# Patient Record
Sex: Male | Born: 1980 | Race: Black or African American | Hispanic: No | Marital: Married | Smoking: Current every day smoker
Health system: Southern US, Community
[De-identification: ages and names within clinical notes are randomized; demographics above are authoritative.]

## PROBLEM LIST (undated history)

## (undated) HISTORY — PX: APPENDECTOMY: SHX54

---

## 2009-09-14 ENCOUNTER — Emergency Department (HOSPITAL_COMMUNITY): Admission: EM | Admit: 2009-09-14 | Discharge: 2009-09-14 | Payer: Self-pay | Admitting: Emergency Medicine

## 2010-01-21 ENCOUNTER — Emergency Department (HOSPITAL_COMMUNITY): Admission: EM | Admit: 2010-01-21 | Discharge: 2010-01-21 | Payer: Self-pay | Admitting: Emergency Medicine

## 2011-02-03 ENCOUNTER — Emergency Department (HOSPITAL_COMMUNITY): Payer: Managed Care, Other (non HMO)

## 2011-02-03 ENCOUNTER — Other Ambulatory Visit: Payer: Self-pay | Admitting: General Surgery

## 2011-02-03 ENCOUNTER — Inpatient Hospital Stay (HOSPITAL_COMMUNITY)
Admission: EM | Admit: 2011-02-03 | Discharge: 2011-02-04 | DRG: 343 | Disposition: A | Discharge status: Home / Self Care | Payer: Managed Care, Other (non HMO) | Attending: General Surgery | Admitting: General Surgery

## 2011-02-03 ENCOUNTER — Encounter (HOSPITAL_COMMUNITY): Payer: Self-pay

## 2011-02-03 DIAGNOSIS — K3580 Unspecified acute appendicitis: Principal | ICD-10-CM | POA: Diagnosis present

## 2011-02-03 DIAGNOSIS — F17200 Nicotine dependence, unspecified, uncomplicated: Secondary | ICD-10-CM | POA: Diagnosis present

## 2011-02-03 LAB — URINE MICROSCOPIC-ADD ON

## 2011-02-03 LAB — DIFFERENTIAL
Basophils Absolute: 0 10*3/uL (ref 0.0–0.1)
Basophils Relative: 0 % (ref 0–1)
Eosinophils Absolute: 0 10*3/uL (ref 0.0–0.7)
Monocytes Absolute: 0.7 10*3/uL (ref 0.1–1.0)
Monocytes Relative: 7 % (ref 3–12)
Neutro Abs: 7.9 10*3/uL — ABNORMAL HIGH (ref 1.7–7.7)
Neutrophils Relative %: 80 % — ABNORMAL HIGH (ref 43–77)

## 2011-02-03 LAB — URINALYSIS, ROUTINE W REFLEX MICROSCOPIC
Glucose, UA: NEGATIVE mg/dL
Ketones, ur: NEGATIVE mg/dL
Leukocytes, UA: NEGATIVE
Nitrite: NEGATIVE
pH: 6 (ref 5.0–8.0)

## 2011-02-03 LAB — CBC
Hemoglobin: 14.7 g/dL (ref 13.0–17.0)
MCH: 29.1 pg (ref 26.0–34.0)
MCHC: 34.7 g/dL (ref 30.0–36.0)
Platelets: 203 10*3/uL (ref 150–400)
RBC: 5.06 MIL/uL (ref 4.22–5.81)

## 2011-02-03 LAB — COMPREHENSIVE METABOLIC PANEL
ALT: 25 U/L (ref 0–53)
AST: 35 U/L (ref 0–37)
Albumin: 4 g/dL (ref 3.5–5.2)
Calcium: 9.2 mg/dL (ref 8.4–10.5)
Creatinine, Ser: 1.06 mg/dL (ref 0.4–1.5)
GFR calc Af Amer: >60 mL/min (ref >60)
Sodium: 134 mEq/L — ABNORMAL LOW (ref 135–145)

## 2011-02-03 MED ORDER — IOHEXOL 300 MG/ML  SOLN
100.0000 mL | Freq: Once | INTRAMUSCULAR | Status: AC | PRN
  Administered 2011-02-03: 100 mL via INTRAVENOUS

## 2011-02-04 LAB — CBC
HCT: 37.2 % — ABNORMAL LOW (ref 39.0–52.0)
Hemoglobin: 12.5 g/dL — ABNORMAL LOW (ref 13.0–17.0)
MCH: 28.5 pg (ref 26.0–34.0)
MCHC: 33.6 g/dL (ref 30.0–36.0)
MCV: 84.7 fL (ref 78.0–100.0)
RBC: 4.39 MIL/uL (ref 4.22–5.81)

## 2011-02-06 NOTE — Discharge Summary (Signed)
  NAMEMarland Jordan  BERLE, FITZ NO.:  [id number]  MEDICAL RECORD NO.:  [id number]           PATIENT TYPE:  I  LOCATION:  1528                         FACILITY:  [facility]  PHYSICIAN:  Sandria Bales. Ezzard Standing, M.D.  DATE OF BIRTH:  19-Oct-1981  DATE OF ADMISSION:  02/03/2011 DATE OF DISCHARGE:  02/04/2011                              DISCHARGE SUMMARY   DISCHARGE DIAGNOSIS:  Acute appendicitis.  OPERATIONS PERFORMED:  The patient had a laparoscopic appendectomy by Dr. Glenna Fellows on February 03, 2011.  HISTORY OF ILLNESS:  [name] is an otherwise healthy 30 year old black male who had about a 24-hour history of abdominal pain, presented to the [facility] Emergency Room who had a CT scan consistent with appendicitis.  He otherwise has no significant medical issues.  He was taken to the operating room by Dr. Johna Sheriff on February 03, 2011, where he underwent a laparoscopic appendectomy.  The patient is now 1 day postop.  His temperature is 98.3, pulse 65, white blood count is 11,600.  He is taking p.o.'s and is ready for discharge.  His discharge instructions include no driving for 2 days.   He is unlimited on his diet.   He can shower starting tomorrow.  He is to call and make an appointment with Dr. Johna Sheriff in 2-3 weeks. He is given Vicodin for pain for discharge, he is given 20 tablets with 1 refill and I wrote him a note to return to work on February 15, 2011.   Sandria Bales. Ezzard Standing, M.D., FACS   DHN/MEDQ  D:  02/04/2011  T:  02/04/2011  Job:  [id number]  Electronically Signed by Ovidio Kin M.D. on 02/05/2011 05:44:26 PM

## 2011-02-13 NOTE — Op Note (Signed)
NAMEMarland Jordan  LANARD, ARGUIJO NO.:  [id number]  MEDICAL RECORD NO.:  [id number]           PATIENT TYPE:  E  LOCATION:  WLED                         FACILITY:  [facility]  PHYSICIAN:  Sharlet Salina T. Iza Preston, M.D.DATE OF BIRTH:  03/22/1981  DATE OF PROCEDURE:  02/03/2011 DATE OF DISCHARGE:                              OPERATIVE REPORT   PREOPERATIVE DIAGNOSIS:  Acute appendicitis.  POSTOPERATIVE DIAGNOSIS:  Acute appendicitis.  SURGICAL PROCEDURE:  Laparoscopic appendectomy.  SURGEON:  Lorne Skeens. Zamora Colton, M.D.  ANESTHESIA:  General.  BRIEF HISTORY:  Jonathon Jordan is a 30 year old male who presents with about 30 hours of progressive right lower quadrant abdominal pain and anorexia.  He has localized tenderness in the right lower quadrant. White count was normal.  CT scan, however, reveals a significantly edematous dilated appendix with appendicolith and significant periappendiceal inflammation but no evidence of perforation.  For these findings, I have recommended proceeding with emergency laparoscopic and possible open appendectomy.  The nature of the procedure, its indications, risks of anesthetic complications, bleeding, infection, possible need for open procedure were discussed and understood.  He was brought to operating room for this procedure.  DESCRIPTION OF OPERATION:  The patient received broad-spectrum IV antibiotics.  He was brought to the operating room, placed in supine position on the operating room table and general orotracheal anesthesia was induced.  Foley catheter was placed.  The abdomen was widely sterilely prepped and draped.  PAS were in place.  Correct patient and procedure were verified.  The patient was noted to have a small umbilical hernia.  An infraumbilical incision was used and the umbilical skin dissected up off the hernia and the peritoneal sac excised.  This left about a 1 cm fascial defect.  Stay sutures were placed on either side  and the [facility] trocar placed and pneumoperitoneum established. Under direct vision, a 5-mm trocar was placed in the right upper quadrant and a 12-mm trocar in the left lower quadrant.  The cecum and base of appendix were identified and the base of the appendix was inflamed, dilated, and edematous extending down right to the base of the appendix.  Tracing the appendix out distally, it became markedly dilated with significant both acute and subacute inflammation and curled back up and was very adherent to the lateral pelvic sidewall and back up to the inferior lateral portion of the cecum.  Using the harmonic scalpel and careful blunt dissection, the peritoneum was incised lateral to the appendix and was carefully mobilized away from the pelvic sidewall. There was significant inflammation and adhesions around the appendix consistent with recurrent or subacute inflammation as well as acute inflammation.  However, with rather tedious careful dissection, I was able to mobilize the appendix off the lateral pelvic sidewall, identified the tip of the appendix, got around the tip of the appendix and then began to mobilize it away from the cecum.  As I incised the inflammatory attachments between the cecum and the appendix, I got into a cleaner and edematous plane and was able to easily bluntly dissect the appendix away from the cecum without any concern of injury to the cecum. The appendix  was then able eventually to be elevated and the edematous somewhat fibrotic mesoappendix was sequentially divided with the harmonic scalpel working back towards the base of appendix until the appendix was completely free including the tip of the cecum.  Although the tissue was edematous, it looked amenable for staple closure and a blue load 45-mm linear stapler was used to divide the appendix at the tip of the cecum.  The staple line was intact and without bleeding. Appendix was placed in EndoCatch bag and  brought out through the umbilicus.  Due to the large appendicolith and very large appendix, I had to extend the fascial defect laterally for about 0.5 cm on either direction to remove the appendix.  Following this, the fascial edges were clearly defined and then initially 2-0 Prolene sutures were placed on either corner of the transverse umbilical fascial defect.  The [facility] trocar replaced, pneumoperitoneum established.  The abdomen was thoroughly irrigated.  The appendiceal stump was intact.  There was no bleeding.  No evidence of trocar injury or other problems.  Following this, the umbilical fascial defect was closed with several interrupted 0 Prolene sutures in transverse fashion.  I then reinsufflated the abdomen to make sure this incision was clear of any viscera which it was.  Again there was no evidence of bleeding from the operative site.  All CO2 was evacuated and trocars removed.  Skin incisions were closed with subcuticular Monocryl and Dermabond.  Sponge and needle counts were correct.  The patient was taken to recovery room in good condition.     Lorne Skeens. Jacobey Gura, M.D.     Tory Emerald  D:  02/03/2011  T:  02/03/2011  Job:  [id number]  Electronically Signed by Glenna Fellows M.D. on 02/13/2011 10:02:39 AM

## 2011-02-13 NOTE — H&P (Signed)
  NAMEMarland Kitchen  Jonathon Jordan NO.:  [id number]  MEDICAL RECORD NO.:  [id number]           PATIENT TYPE:  E  LOCATION:  WLED                         FACILITY:  [facility]  PHYSICIAN:  Sharlet Salina T. Erikah Thumm, M.D.DATE OF BIRTH:  Feb 13, 1981  DATE OF ADMISSION:  02/03/2011 DATE OF DISCHARGE:                             HISTORY & PHYSICAL   CHIEF COMPLAINT:  Abdominal pain.  HISTORY OF PRESENT ILLNESS:  Jonathon Jordan is a 30 year old male who a little over 24 hours ago developed the gradual onset of right lower quadrant abdominal pain.  This initially was not severe.  It was somewhat worse with motion and activity.  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  He then began to feel weak and lightheaded and presented to the [facility] Emergency Room.  He has not had any fever or chills.  He has not had a nausea or vomiting, but has had decreased appetite.  Bowel movement was normal yesterday.  He has no history of any chronic or recurring abdominal pain or GI complaints.  PAST MEDICAL HISTORY:  No previous medical, surgical illness or hospitalization.  MEDICATIONS:  None.  ALLERGIES:  None.  SOCIAL HISTORY:  Smokes several cigarettes per day.  Drinks an occasional alcoholic drink.  FAMILY HISTORY:  Noncontributory.  REVIEW OF SYSTEMS:  GENERAL:  No fever or chills.  HEENT:  No vision, hearing, or swallowing problems.  RESPIRATORY:  No shortness of breath, asthma, or cough.  CARDIAC:  No history of chest pain or palpitations. ABDOMEN/GASTROINTESTINAL:  As above.  GENITOURINARY:  Negative.  PHYSICAL EXAMINATION:  VITAL SIGNS:  Temperature is 98.7, pulse 109, respirations 20, blood pressure 145/89. GENERAL:  He is a mildly overweight African-American male in no acute distress. SKIN:  Warm and dry.  No rash or infection. HEENT:  No palpable mass or thyromegaly.  Sclerae nonicteric.   Pupils equal, round and reactive.  Oropharynx clear. LUNGS:  Clear without wheezing or increased work of breathing. CARDIAC:  Regular rate and rhythm.  No murmurs.  No edema. ABDOMEN:  Bowel sounds are hypoactive.  There is localized right lower quadrant tenderness with some guarding.  No palpable masses or hepatosplenomegaly. EXTREMITIES:  No joint swelling or deformity. NEUROLOGIC:  Alert and fully oriented.  Motor and sensory examinations grossly normal.  LABORATORY DATA:  Lipase, electrolytes, LFTs, CBC are all within normal limits with a white count of 9.9.  Urinalysis unremarkable.  RADIOLOGIC:  CT scan of the abdomen and pelvis was reviewed.  This shows an appendicolith and a significantly dilated appendix with periappendiceal inflammation consistent with acute appendicitis.  No evidence of perforation or abscess.  ASSESSMENT AND PLAN:  Acute appendicitis.  The patient will receive broad-spectrum intravenous antibiotics and will be taken to the operating room for emergency laparoscopic and possible open appendectomy.     Lorne Skeens. Chabely Norby, M.D.     Tory Emerald  D:  02/03/2011  T:  02/03/2011  Job:  [id number]  Electronically Signed by Glenna Fellows M.D. on 02/13/2011 10:02:26 AM

## 2012-01-15 ENCOUNTER — Emergency Department (HOSPITAL_COMMUNITY)
Admission: EM | Admit: 2012-01-15 | Discharge: 2012-01-15 | Disposition: A | Discharge status: Home / Self Care | Payer: Managed Care, Other (non HMO) | Attending: Emergency Medicine | Admitting: Emergency Medicine

## 2012-01-15 ENCOUNTER — Emergency Department (HOSPITAL_COMMUNITY): Payer: Managed Care, Other (non HMO)

## 2012-01-15 ENCOUNTER — Encounter (HOSPITAL_COMMUNITY): Payer: Self-pay | Admitting: *Deleted

## 2012-01-15 DIAGNOSIS — Y9289 Other specified places as the place of occurrence of the external cause: Secondary | ICD-10-CM | POA: Insufficient documentation

## 2012-01-15 DIAGNOSIS — S99929A Unspecified injury of unspecified foot, initial encounter: Secondary | ICD-10-CM | POA: Insufficient documentation

## 2012-01-15 DIAGNOSIS — IMO0002 Reserved for concepts with insufficient information to code with codable children: Secondary | ICD-10-CM | POA: Insufficient documentation

## 2012-01-15 DIAGNOSIS — S8990XA Unspecified injury of unspecified lower leg, initial encounter: Secondary | ICD-10-CM | POA: Insufficient documentation

## 2012-01-15 DIAGNOSIS — S99919A Unspecified injury of unspecified ankle, initial encounter: Secondary | ICD-10-CM

## 2012-01-15 MED ORDER — TRAMADOL HCL 50 MG PO TABS: 50.0000 mg | ORAL_TABLET | Freq: Four times a day (QID) | ORAL | Status: AC | PRN

## 2012-01-15 NOTE — Discharge Instructions (Signed)
Athletic Injuries   Proper early treatment and rehabilitation leads to a quicker recovery for most athletic injuries. You may be able to return to your sport fully recovered in less time if you follow these general rules:   Rest. Rest the injury until movement is no longer painful. Using an injured joint or muscle will prolong the problem.   Elevate. Keep the injured area elevated until most of the swelling and pain are gone. If possible, keep the injured area above the level of your heart.   Ice. Use ice packs directly on the injury for 3 to 4 days.   Compression. Use an elastic bandage applied to your injury as directed. This will reduce swelling, although elastic wraps do not protect injured joints. More rigid splints and taping are better for this purpose.   Rehabilitation. This should begin as soon as the swelling and pain of your injury subside, and as directed by your caregiver. It includes exercises to improve joint motion and muscular strength. Occasionally special braces, splints, or orthotics are used to protect against further injury when you return to your sport.  Keeping a positive attitude will help you heal your injury more rapidly and completely. You may return to physical exercise that does not cause pain or increase the risk of re-injury or as directed. This will help maintain fitness. It will also improve your mental attitude. Do not overuse your injured extremity. This will lead to discomfort and may delay full recovery.   Document Released: 11/16/2004 Document Revised: 09/28/2011 Document Reviewed: 04/06/2009   Exit[employer] Patient Information 2012 [employer].

## 2012-01-15 NOTE — ED Notes (Signed)
Pt reports being at work today and a Neurosurgeon hit back of ankle/heel of foot. C/o pain and swelling. Heard a "pop" when he put pressure on it after injury.

## 2012-01-15 NOTE — ED Provider Notes (Signed)
History     CSN: [id number]  Arrival date & time 01/15/12  1429   First MD Initiated Contact with Patient 01/15/12 1641      Chief Complaint  Patient presents with  . Ankle Pain    (Consider location/radiation/quality/duration/timing/severity/associated sxs/prior treatment) HPI   She presents to the emergency department with complaints of left ankle/heel pain. The patient was at work this morning and accidentally ran into himself with a pallet cart. Patient states that he did hear a pop and that he is able to walk on it but not with his normal gait. He denies a history of ankle problems. He denies being hurt anywhere else, hitting his head, loss of consciousness, weakness, pain when just sitting. History reviewed. No pertinent past medical history.  Past Surgical History  Procedure Date  . Appendectomy     No family history on file.  History  Substance Use Topics  . Smoking status: Current Everyday Smoker  . Smokeless tobacco: Not on file  . Alcohol Use: Yes     socially      Review of Systems  All other systems reviewed and are negative.    Allergies  Review of patient's allergies indicates no known allergies.  Home Medications   Current Outpatient Rx  Name Route Sig Dispense Refill  . ADULT MULTIVITAMIN W/MINERALS CH Oral Take 1 tablet by mouth daily.      BP 166/80  Pulse 60  Temp(Src) 98.7 F (37.1 C) (Oral)  Resp 14  SpO2 100%  Physical Exam  Nursing note and vitals reviewed. Constitutional: He appears well-developed and well-nourished. No distress.  HENT:  Head: Normocephalic and atraumatic.  Eyes: Pupils are equal, round, and reactive to light.  Neck: Normal range of motion. Neck supple.  Cardiovascular: Normal rate and regular rhythm.   Pulmonary/Chest: Effort normal.  Abdominal: Soft.  Musculoskeletal:       Left ankle: He exhibits decreased range of motion (due to pain), swelling and ecchymosis (minimal ecchymosis). He exhibits no  deformity, no laceration and normal pulse. tenderness (achellis). Medial malleolus tenderness found. Achilles tendon exhibits pain. Achilles tendon exhibits no defect and normal Thompson's test results.       Feet:  Neurological: He is alert.  Skin: Skin is warm and dry.    ED Course  Procedures (including critical care time)  Labs Reviewed - No data to display Dg Ankle Complete Left  01/15/2012  *RADIOLOGY REPORT*  Clinical Data: Ankle pain, trauma  LEFT ANKLE COMPLETE - 3+ VIEW  Comparison: None  Findings: There is no evidence of fracture or dislocation.  There is no evidence of arthropathy or other focal bone abnormality. Soft tissues are unremarkable.  IMPRESSION: Negative exam.  Original Report Authenticated By: Rosealee Albee, M.D.   Dg Foot Complete Left  01/15/2012  *RADIOLOGY REPORT*  Clinical Data: Ankle pain  LEFT FOOT - COMPLETE 3+ VIEW  Comparison: None  Findings: There is no evidence of fracture or dislocation.  There is no evidence of arthropathy or other focal bone abnormality. Soft tissues are unremarkable.  IMPRESSION: Negative exam.  Original Report Authenticated By: Rosealee Albee, M.D.     1. Ankle injury       MDM  Pt given crutches and ASO. Pt given referral to Ortho and a note off of work for tomorrow.  Pt has been advised of the symptoms that warrant their return to the ED. Patient has voiced understanding and has agreed to follow-up with the PCP or specialist.  Dorthula Matas, PA 01/15/12 [phone]

## 2012-01-16 NOTE — ED Provider Notes (Signed)
Medical screening examination/treatment/procedure(s) were performed by non-physician practitioner and as supervising physician I was immediately available for consultation/collaboration.    Celene Kras, MD 01/16/12 [phone]

## 2014-02-28 ENCOUNTER — Emergency Department (HOSPITAL_COMMUNITY)
Admission: EM | Admit: 2014-02-28 | Discharge: 2014-02-28 | Disposition: A | Discharge status: Home / Self Care | Payer: Managed Care, Other (non HMO) | Attending: Emergency Medicine | Admitting: Emergency Medicine

## 2014-02-28 ENCOUNTER — Encounter (HOSPITAL_COMMUNITY): Payer: Self-pay | Admitting: Emergency Medicine

## 2014-02-28 ENCOUNTER — Emergency Department (HOSPITAL_COMMUNITY): Payer: Managed Care, Other (non HMO)

## 2014-02-28 DIAGNOSIS — J36 Peritonsillar abscess: Secondary | ICD-10-CM | POA: Insufficient documentation

## 2014-02-28 DIAGNOSIS — F17200 Nicotine dependence, unspecified, uncomplicated: Secondary | ICD-10-CM | POA: Insufficient documentation

## 2014-02-28 LAB — I-STAT CHEM 8, ED
BUN: 24 mg/dL — ABNORMAL HIGH (ref 6–23)
CHLORIDE: 99 meq/L (ref 96–112)
Calcium, Ion: 1.09 mmol/L — ABNORMAL LOW (ref 1.12–1.23)
Creatinine, Ser: 0.8 mg/dL (ref 0.50–1.35)
Glucose, Bld: 105 mg/dL — ABNORMAL HIGH (ref 70–99)
HEMATOCRIT: 52 % (ref 39.0–52.0)
HEMOGLOBIN: 17.7 g/dL — AB (ref 13.0–17.0)
POTASSIUM: 6.4 meq/L — AB (ref 3.7–5.3)
Sodium: 135 mEq/L — ABNORMAL LOW (ref 137–147)
TCO2: 26 mmol/L (ref 0–100)

## 2014-02-28 LAB — POTASSIUM: POTASSIUM: 5 meq/L (ref 3.7–5.3)

## 2014-02-28 MED ORDER — MORPHINE SULFATE 4 MG/ML IJ SOLN
4.0000 mg | Freq: Once | INTRAMUSCULAR | Status: AC
  Administered 2014-02-28: 4 mg via INTRAVENOUS
  Filled 2014-02-28: qty 1

## 2014-02-28 MED ORDER — HYDROCODONE-ACETAMINOPHEN 5-325 MG PO TABS: 2.0000 | ORAL_TABLET | ORAL | Status: AC | PRN

## 2014-02-28 MED ORDER — IOHEXOL 300 MG/ML  SOLN
100.0000 mL | Freq: Once | INTRAMUSCULAR | Status: AC | PRN
Start: 2014-02-28 — End: 2014-02-28
  Administered 2014-02-28: 100 mL via INTRAVENOUS

## 2014-02-28 MED ORDER — CLINDAMYCIN HCL 300 MG PO CAPS: 300.0000 mg | ORAL_CAPSULE | Freq: Three times a day (TID) | ORAL | Status: AC

## 2014-02-28 NOTE — ED Notes (Signed)
Patient transported to CT 

## 2014-02-28 NOTE — ED Notes (Signed)
Patient returned from CT

## 2014-02-28 NOTE — ED Notes (Signed)
Pt reports tonsil swelling and pain since last week. Seen at Urgent care and given steroids and pain meds for L sided swelling and pain but Monday began having R sided throat pain. C/o difficultly and painful swallowing as well. Effective airway clearance.

## 2014-02-28 NOTE — Consult Note (Signed)
Reason for Consult:Bilateral peritonsilar abscess Referring Physician: Doug SouSam Jacubowitz, MD  Jonathon Jordan is an 33 y.o. male.  HPI: 5 day history of right sided sore throat, not getting better on Amoxicillin. CT revealed bilateral PTA, larger on the right. No significant history of tonsillitis.  History reviewed. No pertinent past medical history.  Past Surgical History  Procedure Laterality Date  . Appendectomy      No family history on file.  Social History:  reports that he has been smoking.  He does not have any smokeless tobacco history on file. He reports that he drinks alcohol. He reports that he does not use illicit drugs.  Allergies: No Known Allergies  Medications: Reviewed  Results for orders placed during the hospital encounter of 02/28/14 (from the past 48 hour(s))  I-STAT CHEM 8, ED     Status: Abnormal   Collection Time    02/28/14  4:32 PM      Result Value Ref Range   Sodium 135 (*) 137 - 147 mEq/L   Potassium 6.4 (*) 3.7 - 5.3 mEq/L   Chloride 99  96 - 112 mEq/L   BUN 24 (*) 6 - 23 mg/dL   Creatinine, Ser 1.610.80  0.50 - 1.35 mg/dL   Glucose, Bld 096105 (*) 70 - 99 mg/dL   Calcium, Ion 0.451.09 (*) 1.12 - 1.23 mmol/L   TCO2 26  0 - 100 mmol/L   Hemoglobin 17.7 (*) 13.0 - 17.0 g/dL   HCT 40.952.0  81.139.0 - 91.452.0 %  POTASSIUM     Status: None   Collection Time    02/28/14  5:40 PM      Result Value Ref Range   Potassium 5.0  3.7 - 5.3 mEq/L   Comment: SLIGHT HEMOLYSIS     HEMOLYSIS AT THIS LEVEL MAY AFFECT RESULT    Ct Soft Tissue Neck W Contrast  02/28/2014   CLINICAL DATA:  Tonsillar swelling and pain for 1 week. Rule out abscess. Difficulty and painful swallowing.  EXAM: CT NECK WITH CONTRAST  TECHNIQUE: Multidetector CT imaging of the neck was performed using the standard protocol following the bolus administration of intravenous contrast.  CONTRAST:  100mL OMNIPAQUE IOHEXOL 300 MG/ML  SOLN  COMPARISON:  None.  FINDINGS: Orbits are normal symmetric.  Paranasal  sinuses are clear.  There is moderate prominence of the right peritonsillar soft tissues containing a faintly rim enhancing fluid collection measuring 2.3 x 3 cm in its transverse and AP dimension compatible with a peritonsillar abscess. This abscess extends inferiorly approximately 6 cm to the level of the hyoid bone. There is mass effect upon the pharyngeal airway as the airway is displaced to the left and remains patent. There is a small ovoid fluid collection over the left posterior peritonsillar tissues measuring 1 x 1.2 cm. The epiglottis subglottic airway is within normal. Remaining spaces of the suprahyoid neck are unremarkable. The infrahyoid neck is within normal. Visualize superior mediastinum and lungs are normal.  IMPRESSION: Evidence of a right peritonsillar abscess measuring 2.3 x 3 cm in extending craniocaudally 6 cm to the level of the hyoid bone. Mild displacement of the pharyngeal airway to the left, although no significant compromise of the airway. Small left posterior peritonsillar abscess measuring 1 x 1.2 cm.   Electronically Signed   By: Jonathon Fortisaniel  Jordan M.D.   On: 02/28/2014 17:54    NWG:NFAOZHYQROS:Negative except as listed in admit H&P  Blood pressure 144/85, pulse 78, temperature 100 F (37.8 C), temperature source Oral, resp.  rate 17, SpO2 100.00%.  PHYSICAL EXAM: Overall appearance:  Healthy appearing, in no distress Head:  Normocephalic, atraumatic. Ears: External ears looked healthy. Nose: External nose is healthy in appearance. Internal nasal exam free of any lesions or obstruction. Oral Cavity:  There are no mucosal lesions or masses identified. There is mild asymmetry of the soft palate on the right side. No surface a exudate. No trismus. Neuro:  No identifiable neurologic deficits. Neck: No palpable neck masses.  Studies Reviewed: CT reviewed.  Procedures: Incision and drainage right peritonsillar abscess.  Xylocaine with epinephrine was infiltrated into the right soft  palate. An 18-gauge needle was used to aspirate about 1-1/2 cc of pus. 15 scalpel was used to incise the mucosa and the tonsil hemostat was used to spread open the peritonsillar plane. Small amount of additional pus was obtained. He tolerated this well.   Assessment/Plan: Status post incision and drainage right peritonsillar abscess. Change antibiotic to clindamycin. Followup with me early in the week or sooner if he gets any worse.  Jonathon Jordan 02/28/2014, 6:43 PM

## 2014-02-28 NOTE — ED Provider Notes (Signed)
C/o sore throat on right side swalllonig. Pt handling secretions well. Non  Toxic appearing  Doug SouSam Katryn Plummer, MD 02/28/14 [phone]

## 2014-02-28 NOTE — Discharge Instructions (Signed)
ReCheck with Dr. Pollyann Kennedyosen this week.  Call for appointment. Call Dr. Lucky Rathkeosen's office with any worsening of symptoms.  Peritonsillar Abscess A peritonsillar abscess is a collection of pus located in the back of the throat behind the tonsils. It usually occurs when a streptococcal infection of the throat or tonsils spreads into the space around the tonsils. They are almost always caused by the streptococcal germ (bacteria). The treatment of a peritonsillar abscess is most often drainage accomplished by putting a needle into the abscess or cutting (incising) and draining the abscess. This is most often followed with a course of antibiotics. HOME CARE INSTRUCTIONS  If your abscess was drained by your caregiver today, rinse your throat (gargle) with warm salt water four times per day or as needed for comfort. Do not swallow this mixture. Mix 1 teaspoon of salt in 8 ounces of warm water for gargling.  Rest in bed as needed. Resume activities as able.  Apply cold to your neck for pain relief. Fill a plastic bag with ice and wrap it in a towel. Hold the ice on your neck for 20 minutes 4 times per day.  Eat a soft or liquid diet as tolerated while your throat remains sore. Popsicles and ice cream may be good early choices. Drinking plenty of cold fluids will probably be soothing and help take swelling down in between the warm gargles.  Only take over-the-counter or prescription medicines for pain, discomfort, or fever as directed by your caregiver. Do not use aspirin unless directed by your physician. Aspirin slows down the clotting process. It can also cause bleeding from the drainage area if this was needled or incised today.  If antibiotics were prescribed, take them as directed for the full course of the prescription. Even if you feel you are well, you need to take them. SEEK MEDICAL CARE IF:   You have increased pain, swelling, redness, or drainage in your throat.  You develop signs of infection such  as dizziness, headache, lethargy, or generalized feelings of illness.  You have difficulty breathing, swallowing or eating.  You show signs of becoming dehydrated (lightheadedness when standing, decreased urine output, a fast heart rate, or dry mouth and mucous membranes). SEEK IMMEDIATE MEDICAL CARE IF:   You have a fever.  You are coughing up or vomiting blood.  You develop more severe throat pain uncontrolled with medicines or you start to drool.  You develop difficulty breathing, talking, or find it easier to breathe while leaning forward. Document Released: 10/09/2005 Document Revised: 01/01/2012 Document Reviewed: 05/22/2008 [facility] CenterExit[facility] Patient Information 2014 [place]Care, [place].

## 2014-02-28 NOTE — ED Provider Notes (Signed)
CSN: [id number]     Arrival date & time 02/28/14  1407 History   First MD Initiated Contact with Patient 02/28/14 1414     Chief Complaint  Patient presents with  . Sore Throat     (Consider location/radiation/quality/duration/timing/severity/associated sxs/prior Treatment) Patient is a 33 y.o. male presenting with pharyngitis. The history is provided by the patient and medical records.  Sore Throat Associated symptoms include a sore throat.   This is a 33 year old male with no significant past medical history presenting to the ED for sore throat. Patient states he's been treated for strep throat twice in the past 2 weeks with amoxicillin. States he's been on his most recent course of antibiotics for 6 days already without improvement. He states he feels swelling along the right side of his throat.  States he has pain with swallowing, and occasionally feels that food gets stuck on the right side of his throat.  He has no difficulty breathing.  States he was also given hydrocodone for pain which has helped minimally.  No past medical history on file. Past Surgical History  Procedure Laterality Date  . Appendectomy     No family history on file. History  Substance Use Topics  . Smoking status: Current Every Day Smoker  . Smokeless tobacco: Not on file  . Alcohol Use: Yes     Comment: socially    Review of Systems  HENT: Positive for sore throat.   All other systems reviewed and are negative.     Allergies  Review of patient's allergies indicates no known allergies.  Home Medications   Prior to Admission medications   Medication Sig Start Date End Date Taking? Authorizing Provider  Multiple Vitamin (MULITIVITAMIN WITH MINERALS) TABS Take 1 tablet by mouth daily.    Historical Provider, MD   BP 141/87  Pulse 72  Temp(Src) 98.5 F (36.9 C) (Oral)  Resp 16  SpO2 99%  Physical Exam  Nursing note and vitals reviewed. Constitutional: He is oriented to person, place, and  time. He appears well-developed and well-nourished. No distress.  HENT:  Head: Normocephalic and atraumatic.  Right Ear: Tympanic membrane and ear canal normal.  Left Ear: Tympanic membrane and ear canal normal.  Nose: Nose normal.  Mouth/Throat: Oropharynx is clear and moist and mucous membranes are normal.  Tonsils asymmetry, R > L with large amount of exudate; uvula slightly deviated left; airway patent; handling secretions appropriately; no difficulty swallowing or speaking; severe halitosis  Eyes: Conjunctivae and EOM are normal. Pupils are equal, round, and reactive to light.  Neck: Normal range of motion. Neck supple.  Cardiovascular: Normal rate, regular rhythm and normal heart sounds.   Pulmonary/Chest: Effort normal and breath sounds normal. No respiratory distress. He has no wheezes.  Musculoskeletal: Normal range of motion.  Neurological: He is alert and oriented to person, place, and time.  Skin: Skin is warm and dry. He is not diaphoretic.  Psychiatric: He has a normal mood and affect.    ED Course  Procedures (including critical care time) Labs Review Labs Reviewed  I-STAT CHEM 8, ED - Abnormal; Notable for the following:    Sodium 135 (*)    Potassium 6.4 (*)    BUN 24 (*)    Glucose, Bld 105 (*)    Calcium, Ion 1.09 (*)    Hemoglobin 17.7 (*)    All other components within normal limits  POTASSIUM    Imaging Review Ct Soft Tissue Neck W Contrast  02/28/2014  CLINICAL DATA:  Tonsillar swelling and pain for 1 week. Rule out abscess. Difficulty and painful swallowing.  EXAM: CT NECK WITH CONTRAST  TECHNIQUE: Multidetector CT imaging of the neck was performed using the standard protocol following the bolus administration of intravenous contrast.  CONTRAST:  100mL OMNIPAQUE IOHEXOL 300 MG/ML  SOLN  COMPARISON:  None.  FINDINGS: Orbits are normal symmetric.  Paranasal sinuses are clear.  There is moderate prominence of the right peritonsillar soft tissues containing a  faintly rim enhancing fluid collection measuring 2.3 x 3 cm in its transverse and AP dimension compatible with a peritonsillar abscess. This abscess extends inferiorly approximately 6 cm to the level of the hyoid bone. There is mass effect upon the pharyngeal airway as the airway is displaced to the left and remains patent. There is a small ovoid fluid collection over the left posterior peritonsillar tissues measuring 1 x 1.2 cm. The epiglottis subglottic airway is within normal. Remaining spaces of the suprahyoid neck are unremarkable. The infrahyoid neck is within normal. Visualize superior mediastinum and lungs are normal.  IMPRESSION: Evidence of a right peritonsillar abscess measuring 2.3 x 3 cm in extending craniocaudally 6 cm to the level of the hyoid bone. Mild displacement of the pharyngeal airway to the left, although no significant compromise of the airway. Small left posterior peritonsillar abscess measuring 1 x 1.2 cm.   Electronically Signed   By: Elberta Fortisaniel  Boyle M.D.   On: 02/28/2014 17:54     EKG Interpretation None      MDM   Final diagnoses:  Peritonsillar abscess   33 year old male who is very been treated twice in the past 2 weeks for strep throat, presenting to the ED for persistent sore throat and feeling of swelling in the right side it started. On exam, his tonsils are asymmetric, R > L, and uvula is slightly deviated to the left. Concern for early peritonsillar abscess. We'll obtain CT soft tissue neck.  CT revealing bilateral peritonsillar abscesses, R larger than left.  i-stat chem 8 with K+ of 6.4, repeat lab K+ WNL at 5.0.  Discussed with ENT, Dr. Pollyann Kennedyosen, who has come to ED and performed bedside drainage.  Pt to be discharged home with clindamycin and pain meds.  FU in office.  Discussed plan with patient, he/she acknowledged understanding and agreed with plan of care.  Return precautions given for new or worsening symptoms.  Garlon HatchetLisa M Kristopher Delk, PA-C 02/28/14 1928

## 2014-03-01 NOTE — ED Provider Notes (Signed)
Medical screening examination/treatment/procedure(s) were performed by non-physician practitioner and as supervising physician I was immediately available for consultation/collaboration.   EKG Interpretation None        Elizebath Wever S Kalanie Fewell, MD 03/01/14 0744 

## 2015-03-06 ENCOUNTER — Encounter (HOSPITAL_COMMUNITY): Payer: Self-pay | Admitting: Emergency Medicine

## 2015-03-06 ENCOUNTER — Emergency Department (HOSPITAL_COMMUNITY)
Admission: EM | Admit: 2015-03-06 | Discharge: 2015-03-06 | Disposition: A | Discharge status: Home / Self Care | Payer: Managed Care, Other (non HMO) | Attending: Emergency Medicine | Admitting: Emergency Medicine

## 2015-03-06 DIAGNOSIS — Z792 Long term (current) use of antibiotics: Secondary | ICD-10-CM | POA: Diagnosis not present

## 2015-03-06 DIAGNOSIS — Z791 Long term (current) use of non-steroidal anti-inflammatories (NSAID): Secondary | ICD-10-CM | POA: Insufficient documentation

## 2015-03-06 DIAGNOSIS — M5489 Other dorsalgia: Secondary | ICD-10-CM

## 2015-03-06 DIAGNOSIS — Z79899 Other long term (current) drug therapy: Secondary | ICD-10-CM | POA: Diagnosis not present

## 2015-03-06 DIAGNOSIS — Z720 Tobacco use: Secondary | ICD-10-CM | POA: Insufficient documentation

## 2015-03-06 DIAGNOSIS — Z0289 Encounter for other administrative examinations: Secondary | ICD-10-CM | POA: Diagnosis not present

## 2015-03-06 DIAGNOSIS — M546 Pain in thoracic spine: Secondary | ICD-10-CM | POA: Insufficient documentation

## 2015-03-06 NOTE — Discharge Instructions (Signed)
Back Pain, Adult Low back pain is very common. About 1 in 5 people have back pain.The cause of low back pain is rarely dangerous. The pain often gets better over time.About half of people with a sudden onset of back pain feel better in just 2 weeks. About 8 in 10 people feel better by 6 weeks.  CAUSES Some common causes of back pain include:  Strain of the muscles or ligaments supporting the spine.  Wear and tear (degeneration) of the spinal discs.  Arthritis.  Direct injury to the back. DIAGNOSIS Most of the time, the direct cause of low back pain is not known.However, back pain can be treated effectively even when the exact cause of the pain is unknown.Answering your caregiver's questions about your overall health and symptoms is one of the most accurate ways to make sure the cause of your pain is not dangerous. If your caregiver needs more information, he or she may order lab work or imaging tests (X-rays or MRIs).However, even if imaging tests show changes in your back, this usually does not require surgery. HOME CARE INSTRUCTIONS For many people, back pain returns.Since low back pain is rarely dangerous, it is often a condition that people can learn to manageon their own.   Remain active. It is stressful on the back to sit or stand in one place. Do not sit, drive, or stand in one place for more than 30 minutes at a time. Take short walks on level surfaces as soon as pain allows.Try to increase the length of time you walk each day.  Do not stay in bed.Resting more than 1 or 2 days can delay your recovery.  Do not avoid exercise or work.Your body is made to move.It is not dangerous to be active, even though your back may hurt.Your back will likely heal faster if you return to being active before your pain is gone.  Pay attention to your body when you bend and lift. Many people have less discomfortwhen lifting if they bend their knees, keep the load close to their bodies,and  avoid twisting. Often, the most comfortable positions are those that put less stress on your recovering back.  Find a comfortable position to sleep. Use a firm mattress and lie on your side with your knees slightly bent. If you lie on your back, put a pillow under your knees.  Only take over-the-counter or prescription medicines as directed by your caregiver. Over-the-counter medicines to reduce pain and inflammation are often the most helpful.Your caregiver may prescribe muscle relaxant drugs.These medicines help dull your pain so you can more quickly return to your normal activities and healthy exercise.  Put ice on the injured area.  Put ice in a plastic bag.  Place a towel between your skin and the bag.  Leave the ice on for 15-20 minutes, 03-04 times a day for the first 2 to 3 days. After that, ice and heat may be alternated to reduce pain and spasms.  Ask your caregiver about trying back exercises and gentle massage. This may be of some benefit.  Avoid feeling anxious or stressed.Stress increases muscle tension and can worsen back pain.It is important to recognize when you are anxious or stressed and learn ways to manage it.Exercise is a great option. SEEK MEDICAL CARE IF:  You have pain that is not relieved with rest or medicine.  You have pain that does not improve in 1 week.  You have new symptoms.  You are generally not feeling well. SEEK   IMMEDIATE MEDICAL CARE IF:   You have pain that radiates from your back into your legs.  You develop new bowel or bladder control problems.  You have unusual weakness or numbness in your arms or legs.  You develop nausea or vomiting.  You develop abdominal pain.  You feel faint. Document Released: 10/09/2005 Document Revised: 04/09/2012 Document Reviewed: 02/10/2014 Exit[employer] Patient Information 2015 [employer]. This information is not intended to replace advice given to you by your health care provider. Make sure you  discuss any questions you have with your health care provider.  

## 2015-03-06 NOTE — ED Provider Notes (Signed)
CSN: [id number]     Arrival date & time 03/06/15  1735 History  This chart was scribed for non-physician practitioner, Teressa LowerVrinda Cantrell Martus, NP, working with Benjiman CoreNathan Jacilyn Sanpedro, MD, by Ronney LionSuzanne Le, ED Scribe. This patient was seen in room WTR7/WTR7 and the patient's care was started at 5:43 PM.      Chief Complaint  Patient presents with  . Back Pain    L sided mid back, x1 week   The history is provided by the patient. No language interpreter was used.   HPI Comments: Jonathon Jordan is a 34 y.o. male who presents to the Emergency Department complaining of constant, left sided mid back pain that began 1 week ago following heavy lifting at work. He was lifting a heavy box when he felt it "clip" his right kneecap. Patient had gone to UC the following day and was given pain medication and a work note that lasted until yesterday. He was supposed to return to the UC to follow up yesterday, but he was unable to do so due to financial issues. He missed today for work because his job requires heavy lifting and he felt it would impede his recovery. He states he has had heavy lifting-related back muscle spasms in the past at a former job, which were usually resolved with rest and days off. He requests a work note for today.   No past medical history on file. Past Surgical History  Procedure Laterality Date  . Appendectomy     No family history on file. History  Substance Use Topics  . Smoking status: Current Every Day Smoker  . Smokeless tobacco: Not on file  . Alcohol Use: Yes     Comment: socially    Review of Systems  Musculoskeletal: Positive for back pain.  All other systems reviewed and are negative.     Allergies  Review of patient's allergies indicates no known allergies.  Home Medications   Prior to Admission medications   Medication Sig Start Date End Date Taking? Authorizing Provider  amoxicillin-clavulanate (AUGMENTIN) 875-125 MG per tablet Take 1 tablet by mouth 2 (two) times  daily. For 10 days 02/23/14   Historical Provider, MD  HYDROcodone-acetaminophen (NORCO/VICODIN) 5-325 MG per tablet Take 1 tablet by mouth every 6 (six) hours as needed for moderate pain.    Historical Provider, MD  HYDROcodone-acetaminophen (NORCO/VICODIN) 5-325 MG per tablet Take 2 tablets by mouth every 4 (four) hours as needed. 02/28/14   Rolland PorterMark James, MD  lidocaine (XYLOCAINE) 2 % solution Use as directed 5 mLs in the mouth or throat every 2 (two) hours as needed for mouth pain.    Historical Provider, MD  meloxicam (MOBIC) 15 MG tablet Take 15 mg by mouth daily.    Historical Provider, MD  naproxen sodium (ANAPROX) 220 MG tablet Take 440 mg by mouth 2 (two) times daily with a meal.    Historical Provider, MD  oxymetazoline (AFRIN) 0.05 % nasal spray Place 1 spray into both nostrils 2 (two) times daily as needed for congestion.    Historical Provider, MD  predniSONE (STERAPRED UNI-PAK) 5 MG TABS tablet Take 5 mg by mouth See admin instructions. Taper 6 day  dosepak 02/26/14   Historical Provider, MD  Triamcinolone Acetonide (KENALOG IJ) Inject as directed once. Gave on Monday at urgent care for Tonsillitis    Historical Provider, MD   BP 155/94 mmHg  Pulse 86  Temp(Src) 98.3 F (36.8 C) (Oral)  Resp 16  Ht 6\' 4"  (1.93 m)  Wt  317 lb (143.79 kg)  BMI 38.60 kg/m2  SpO2 100% Physical Exam  Constitutional: He is oriented to person, place, and time. He appears well-developed and well-nourished. No distress.  HENT:  Head: Normocephalic and atraumatic.  Eyes: Conjunctivae and EOM are normal.  Neck: Neck supple. No tracheal deviation present.  Cardiovascular: Normal rate.   Pulmonary/Chest: Effort normal. No respiratory distress.  Musculoskeletal: Normal range of motion. He exhibits no tenderness.  Neurological: He is alert and oriented to person, place, and time. He exhibits normal muscle tone. Coordination normal.  Skin: Skin is warm and dry.  Psychiatric: He has a normal mood and affect. His  behavior is normal.  Nursing note and vitals reviewed.   ED Course  Procedures (including critical care time)  DIAGNOSTIC STUDIES: Oxygen Saturation is 100% on RA, normal by my interpretation.    COORDINATION OF CARE: 5:45 PM - Discussed treatment plan with pt at bedside which includes referral to orthopedist for treatment, and work note, and pt agreed to plan.  MDM   Final diagnoses:  Right-sided back pain, unspecified location   Pt is here for work note  I personally performed the services described in this documentation, which was scribed in my presence. The recorded information has been reviewed and is accurate.     Teressa LowerVrinda Chaston Bradburn, NP 03/06/15 1951  Benjiman CoreNathan Silus Lanzo, MD 03/07/15 1455

## 2015-05-03 ENCOUNTER — Emergency Department (HOSPITAL_COMMUNITY)
Admission: EM | Admit: 2015-05-03 | Discharge: 2015-05-03 | Disposition: A | Discharge status: Home / Self Care | Payer: Managed Care, Other (non HMO) | Attending: Emergency Medicine | Admitting: Emergency Medicine

## 2015-05-03 ENCOUNTER — Emergency Department (HOSPITAL_COMMUNITY): Payer: Managed Care, Other (non HMO)

## 2015-05-03 ENCOUNTER — Encounter (HOSPITAL_COMMUNITY): Payer: Self-pay

## 2015-05-03 DIAGNOSIS — Y9367 Activity, basketball: Secondary | ICD-10-CM | POA: Diagnosis not present

## 2015-05-03 DIAGNOSIS — Z791 Long term (current) use of non-steroidal anti-inflammatories (NSAID): Secondary | ICD-10-CM | POA: Insufficient documentation

## 2015-05-03 DIAGNOSIS — Y929 Unspecified place or not applicable: Secondary | ICD-10-CM | POA: Diagnosis not present

## 2015-05-03 DIAGNOSIS — Z79899 Other long term (current) drug therapy: Secondary | ICD-10-CM | POA: Diagnosis not present

## 2015-05-03 DIAGNOSIS — S8992XA Unspecified injury of left lower leg, initial encounter: Secondary | ICD-10-CM | POA: Diagnosis present

## 2015-05-03 DIAGNOSIS — Y999 Unspecified external cause status: Secondary | ICD-10-CM | POA: Diagnosis not present

## 2015-05-03 DIAGNOSIS — Z720 Tobacco use: Secondary | ICD-10-CM | POA: Insufficient documentation

## 2015-05-03 DIAGNOSIS — X58XXXA Exposure to other specified factors, initial encounter: Secondary | ICD-10-CM | POA: Diagnosis not present

## 2015-05-03 DIAGNOSIS — M25462 Effusion, left knee: Secondary | ICD-10-CM

## 2015-05-03 MED ORDER — IBUPROFEN 800 MG PO TABS: 800.0000 mg | ORAL_TABLET | Freq: Three times a day (TID) | ORAL | Status: AC

## 2015-05-03 NOTE — ED Notes (Signed)
Ortho tech at bedside 

## 2015-05-03 NOTE — Discharge Instructions (Signed)

## 2015-05-03 NOTE — ED Provider Notes (Signed)
CSN: [id number]     Arrival date & time 05/03/15  1312 History  This chart was scribed for non-physician practitioner, Teressa LowerVrinda Philamena Kramar, NP, working with Raeford RazorStephen Kohut, MD by Charline BillsEssence Howell, ED Scribe. This patient was seen in room WTR8/WTR8 and the patient's care was started at 2:49 PM.   Chief Complaint  Patient presents with  . Knee Injury   The history is provided by the patient. No language interpreter was used.   HPI Comments: Jonathon Jordan is a 34 y.o. male who presents to the Emergency Department complaining of a left knee injury sustained 9 days ago. Pt states that he hyperextended his knee while playing basketball 9 days ago. He reports associated swelling, bruising and pain that seemed to improve until 3-4 days ago. He reports bruising to the back of his left leg first noticed 3-4 days ago and left knee stiffness with bending. Pt has tried 800 mg ibuprofen with mild relief. No previous injury to the affected area.    History reviewed. No pertinent past medical history. Past Surgical History  Procedure Laterality Date  . Appendectomy     No family history on file. History  Substance Use Topics  . Smoking status: Current Every Day Smoker  . Smokeless tobacco: Not on file  . Alcohol Use: Yes     Comment: socially    Review of Systems  Musculoskeletal: Positive for joint swelling and arthralgias.  Skin: Positive for color change.  All other systems reviewed and are negative.  Allergies  Review of patient's allergies indicates no known allergies.  Home Medications   Prior to Admission medications   Medication Sig Start Date End Date Taking? Authorizing Provider  amoxicillin-clavulanate (AUGMENTIN) 875-125 MG per tablet Take 1 tablet by mouth 2 (two) times daily. For 10 days 02/23/14   Historical Provider, MD  HYDROcodone-acetaminophen (NORCO/VICODIN) 5-325 MG per tablet Take 1 tablet by mouth every 6 (six) hours as needed for moderate pain.    Historical Provider, MD   HYDROcodone-acetaminophen (NORCO/VICODIN) 5-325 MG per tablet Take 2 tablets by mouth every 4 (four) hours as needed. 02/28/14   Rolland PorterMark James, MD  lidocaine (XYLOCAINE) 2 % solution Use as directed 5 mLs in the mouth or throat every 2 (two) hours as needed for mouth pain.    Historical Provider, MD  meloxicam (MOBIC) 15 MG tablet Take 15 mg by mouth daily.    Historical Provider, MD  naproxen sodium (ANAPROX) 220 MG tablet Take 440 mg by mouth 2 (two) times daily with a meal.    Historical Provider, MD  oxymetazoline (AFRIN) 0.05 % nasal spray Place 1 spray into both nostrils 2 (two) times daily as needed for congestion.    Historical Provider, MD  predniSONE (STERAPRED UNI-PAK) 5 MG TABS tablet Take 5 mg by mouth See admin instructions. Taper 6 day  dosepak 02/26/14   Historical Provider, MD  Triamcinolone Acetonide (KENALOG IJ) Inject as directed once. Gave on Monday at urgent care for Tonsillitis    Historical Provider, MD   BP 173/97 mmHg  Pulse 96  Temp(Src) 97.9 F (36.6 C) (Oral)  Resp 16  SpO2 99% Physical Exam  Constitutional: He is oriented to person, place, and time. He appears well-developed and well-nourished. No distress.  HENT:  Head: Normocephalic and atraumatic.  Eyes: Conjunctivae and EOM are normal.  Neck: Neck supple. No tracheal deviation present.  Cardiovascular: Normal rate.   Pulmonary/Chest: Effort normal. No respiratory distress.  Musculoskeletal: Normal range of motion.  Obvious swelling noted  to the left knee, no redness or warmth noted. Pulses intact  Neurological: He is alert and oriented to person, place, and time.  Skin: Skin is warm and dry.  Psychiatric: He has a normal mood and affect. His behavior is normal.  Nursing note and vitals reviewed.  ED Course  Procedures (including critical care time) DIAGNOSTIC STUDIES: Oxygen Saturation is 99% on RA, normal by my interpretation.    COORDINATION OF CARE: 2:52 PM-Discussed treatment plan which includes XR,  ice and continue ibuprofen with pt at bedside and pt agreed to plan.   Labs Review Labs Reviewed - No data to display  Imaging Review Dg Knee Complete 4 Views Left  05/03/2015   CLINICAL DATA:  Hyperextended left knee 1-1/2 weeks ago playing basketball. Pain.  EXAM: LEFT KNEE - COMPLETE 4+ VIEW  COMPARISON:  None.  FINDINGS: There is no evidence of fracture or dislocation. There is a moderate joint effusion. There is no evidence of arthropathy or other focal bone abnormality. Soft tissues are unremarkable.  IMPRESSION: No acute osseous injury of the left knee.  Moderate joint effusion.   Electronically Signed   By: Elige Ko   On: 05/03/2015 15:00     EKG Interpretation None      MDM   Final diagnoses:  Knee effusion, left    Pt immobilized and given ortho follow. No acute bony injury noted. Neurovascularly intact  I personally performed the services described in this documentation, which was scribed in my presence. The recorded information has been reviewed and is accurate.    Teressa Lower, NP 05/03/15 1610  Raeford Razor, MD 05/07/15 1331

## 2015-05-03 NOTE — ED Notes (Signed)
Verbalized understanding discharge instructions and referrals. In no acute distress.

## 2015-05-03 NOTE — ED Notes (Signed)
Pt presents with c/o knee injury that occurred on 7/2 after he hyperextended his knee during a basketball game. Pt has been using ice for the pain. Pt reports he noticed some swelling approx 3-4 days ago, ambulatory to triage.

## 2015-05-06 ENCOUNTER — Other Ambulatory Visit: Payer: Self-pay | Admitting: Orthopedic Surgery

## 2015-05-06 DIAGNOSIS — M25562 Pain in left knee: Secondary | ICD-10-CM

## 2015-05-18 ENCOUNTER — Ambulatory Visit
Admission: RE | Admit: 2015-05-18 | Discharge: 2015-05-18 | Disposition: A | Discharge status: Home / Self Care | Payer: Managed Care, Other (non HMO) | Source: Ambulatory Visit | Attending: Orthopedic Surgery | Admitting: Orthopedic Surgery

## 2015-05-18 DIAGNOSIS — M25562 Pain in left knee: Secondary | ICD-10-CM

## 2016-05-13 IMAGING — MR MR KNEE*L* W/O CM
4 of 6 series · 16 of 40 positions shown · non-contrast
Comparison: 05/03/2015

CLINICAL DATA: Left knee pain and swelling. Hyperextension injury
playing basketball on April 24, 2015.

EXAM:
MRI OF THE LEFT KNEE WITHOUT CONTRAST
TECHNIQUE: Multiplanar, multisequence MR imaging of the knee was performed. No
intravenous contrast was administered.

[Series 3: PD · axial · 4.0mm · 0.25mm/px · z∈[-51,+50]mm · 7 of 26 slices shown (1 of 2)]
[im 1/26]
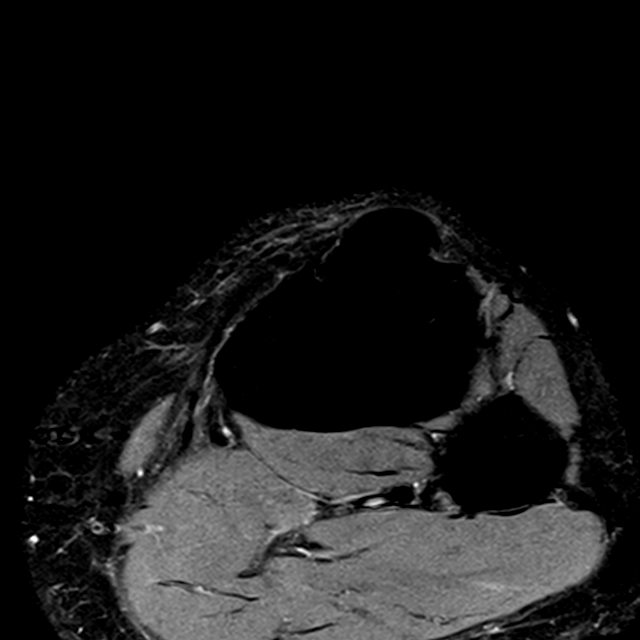
[im 4/26]
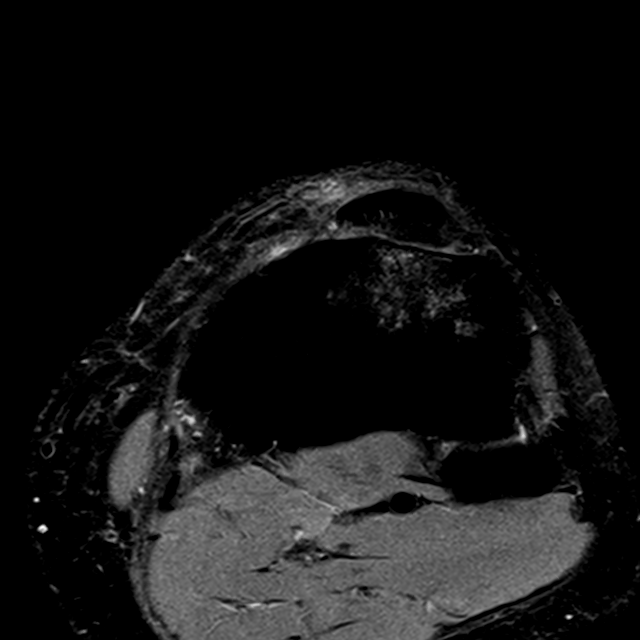
[im 7/26]
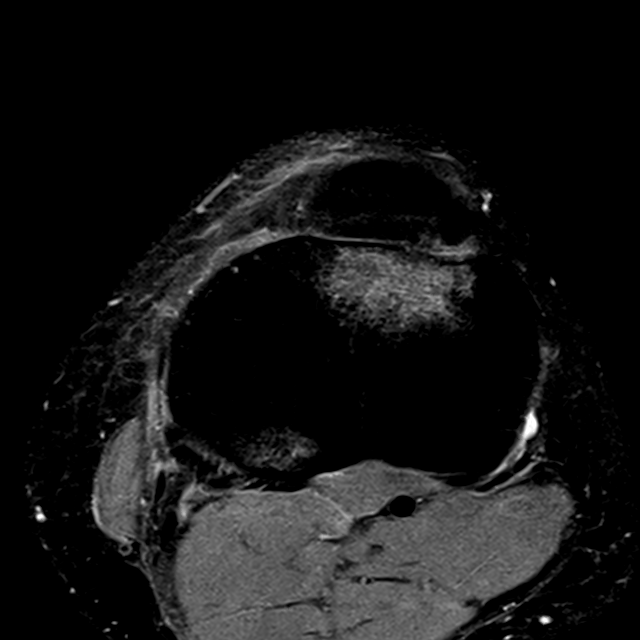
[im 10/26]
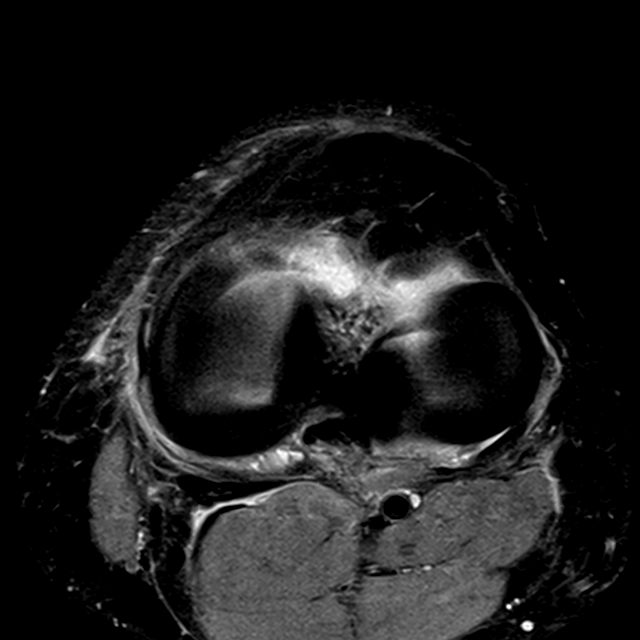
[im 13/26]
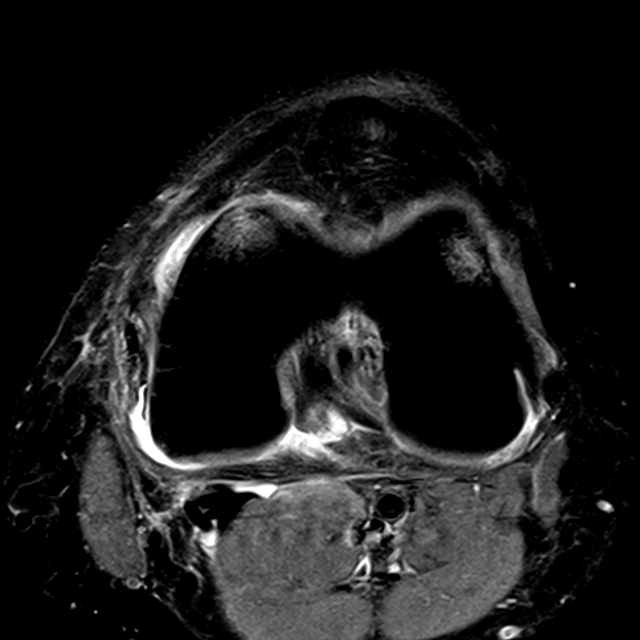
[im 16/26]
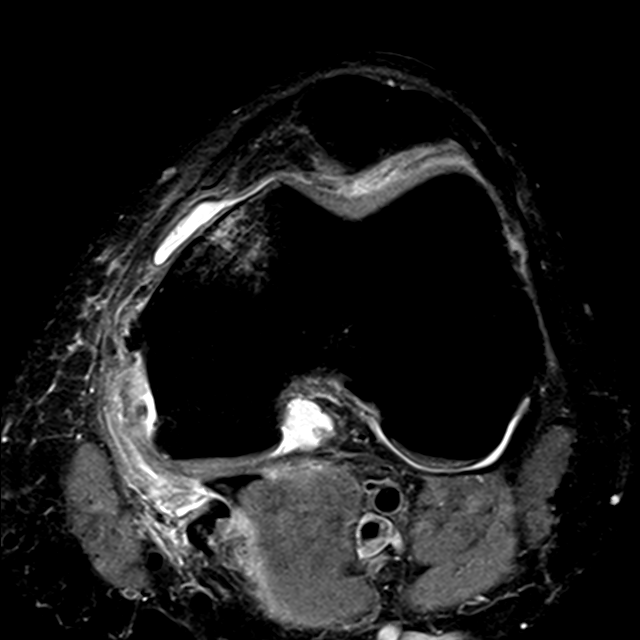
[im 22/26]
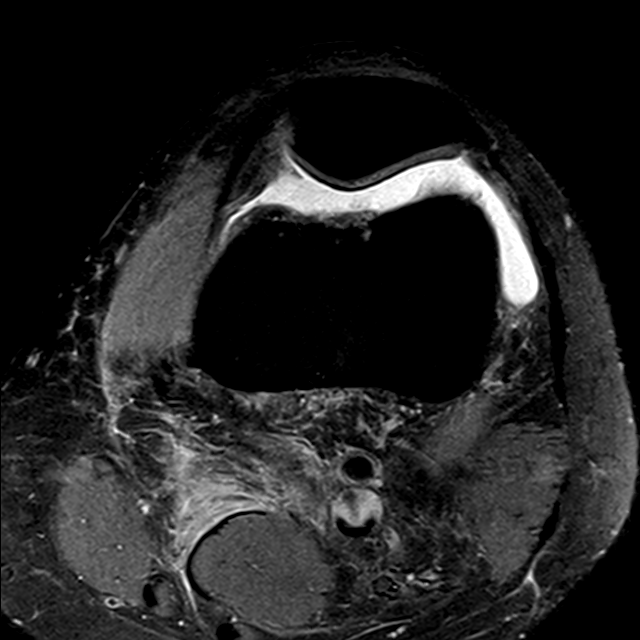

[Series 4: PD · coronal · 4.0mm · 0.31mm/px · 3 of 24 slices shown (2 of 2)]
[im 4/24]
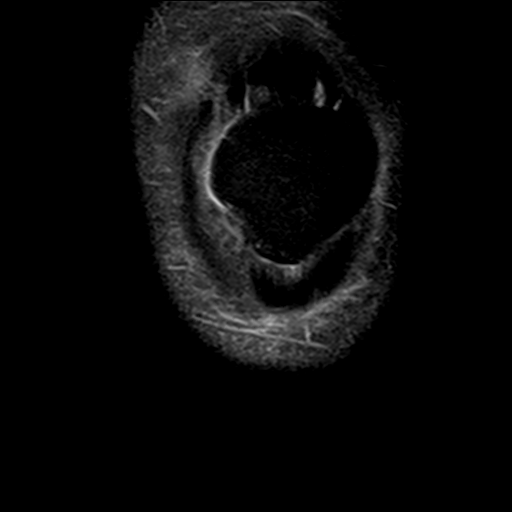
[im 12/24]
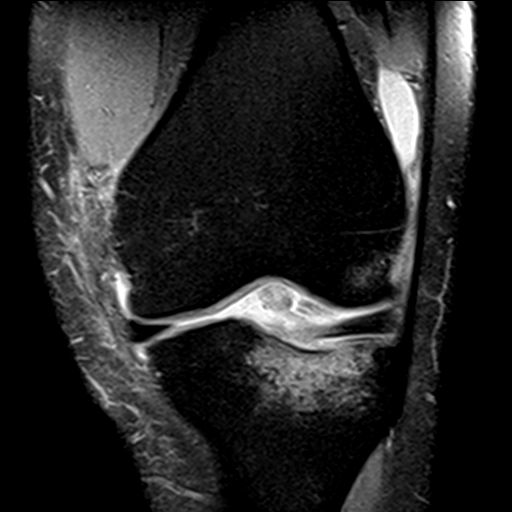
[im 20/24]
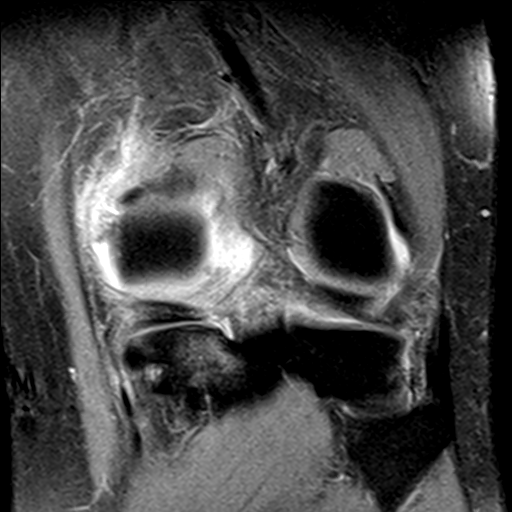

[Series 5: PD fat-sat · sagittal · 4.0mm · 0.31mm/px · 3 of 25 slices shown]
[im 5/25]
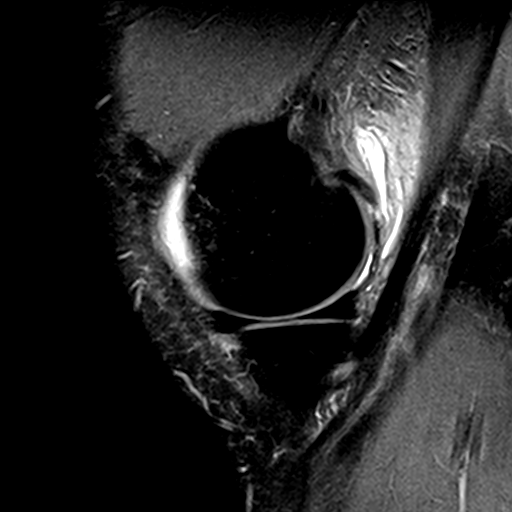
[im 13/25]
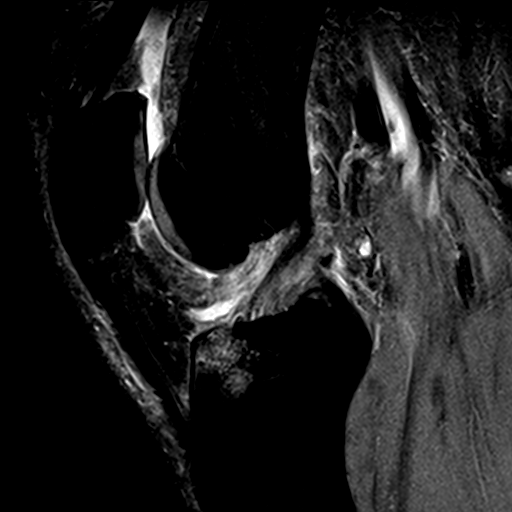
[im 21/25]
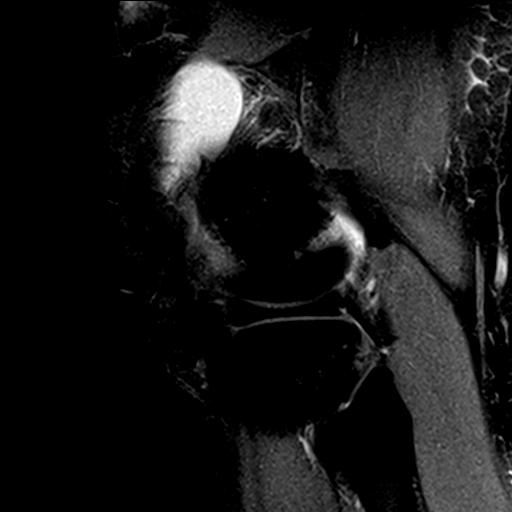

[Series 6: T2 · coronal · 4.0mm · 0.31mm/px · 3 of 24 slices shown]
[im 4/24]
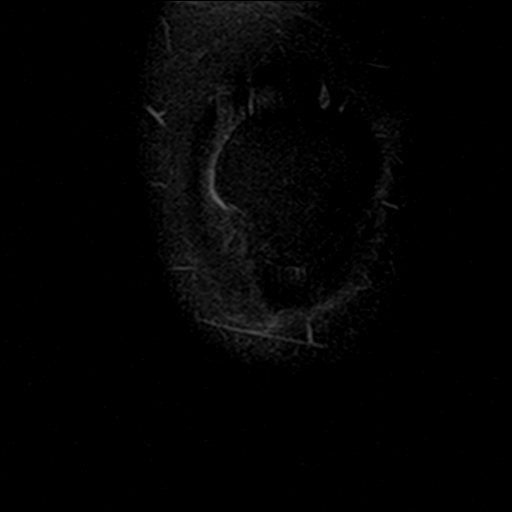
[im 12/24]
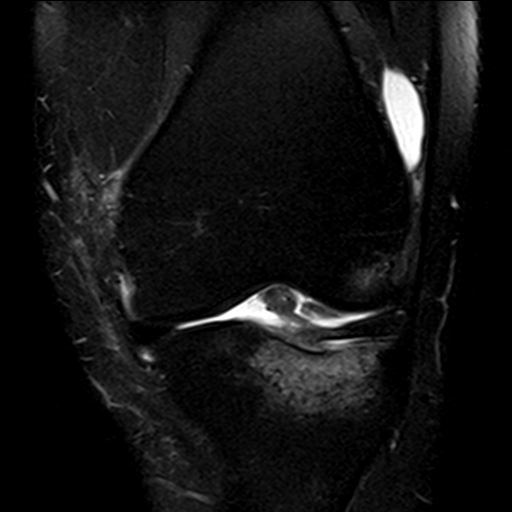
[im 20/24]
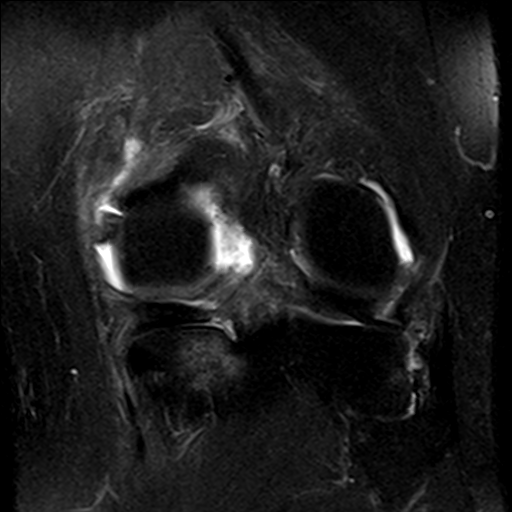

[16 of 40 positions shown; findings below may reference images not displayed]

FINDINGS: MENISCI

Medial meniscus:  Unremarkable

Lateral meniscus:  Unremarkable

LIGAMENTS

Cruciates: Edematous ACL with appropriate slope. Mildly increased T2
signal tracking in the PCL. Neither ligament is completely torn.

Collaterals: Torn MCL proximally with surrounding edema. The MCL is
not completely detached.

CARTILAGE

Patellofemoral:  Unremarkable

Medial:  Unremarkable

Lateral:  Unremarkable

Joint:  Small knee effusion with synovitis.

Popliteal Fossa:  Ruptured Baker's cyst.

Extensor Mechanism:  Unremarkable

Bones: Bone bruising anteriorly along both femoral condyles,
anteriorly in the tibial plateau (especially the lateral tibial
plateau), and posterolaterally in the medial tibial plateau.
IMPRESSION: 1. Hyperextension bone bruises anteriorly in the tibial plateau and
in both femoral condyles. There is also mild bruising
posterolaterally in the medial tibial plateau.
2. Abnormal increased signal in both the ACL and PCL compatible with
mild sprain.
3. Torn MCL proximally, without complete detachment.
4. Small knee effusion with synovitis.  Ruptured Baker's cyst.

## 2018-08-12 ENCOUNTER — Ambulatory Visit (HOSPITAL_COMMUNITY)
Admission: EM | Admit: 2018-08-12 | Discharge: 2018-08-12 | Disposition: A | Discharge status: Home / Self Care | Payer: Managed Care, Other (non HMO) | Attending: Family Medicine | Admitting: Family Medicine

## 2018-08-12 ENCOUNTER — Encounter (HOSPITAL_COMMUNITY): Payer: Self-pay | Admitting: *Deleted

## 2018-08-12 ENCOUNTER — Other Ambulatory Visit: Payer: Self-pay

## 2018-08-12 DIAGNOSIS — S39012A Strain of muscle, fascia and tendon of lower back, initial encounter: Secondary | ICD-10-CM

## 2018-08-12 MED ORDER — CYCLOBENZAPRINE HCL 10 MG PO TABS: 10.0000 mg | ORAL_TABLET | Freq: Two times a day (BID) | ORAL | 0 refills | Status: AC | PRN

## 2018-08-12 NOTE — Discharge Instructions (Addendum)
I believe that you have strained a muscle in your back Continue the ibuprofen for pain inflammation Low-dose muscle relaxant for bedtime.  Be aware this may make you drowsy. Light stretching, heat and gentle massage can help. Follow up as needed for continued or worsening symptoms
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
## 2019-02-25 ENCOUNTER — Ambulatory Visit (HOSPITAL_COMMUNITY)
Admission: EM | Admit: 2019-02-25 | Discharge: 2019-02-25 | Disposition: A | Discharge status: Home / Self Care | Payer: Managed Care, Other (non HMO) | Attending: Family Medicine | Admitting: Family Medicine

## 2019-02-25 ENCOUNTER — Other Ambulatory Visit: Payer: Self-pay

## 2019-02-25 ENCOUNTER — Encounter (HOSPITAL_COMMUNITY): Payer: Self-pay

## 2019-02-25 DIAGNOSIS — R030 Elevated blood-pressure reading, without diagnosis of hypertension: Secondary | ICD-10-CM | POA: Diagnosis not present

## 2019-02-25 DIAGNOSIS — X500XXA Overexertion from strenuous movement or load, initial encounter: Secondary | ICD-10-CM | POA: Diagnosis not present

## 2019-02-25 DIAGNOSIS — S39012A Strain of muscle, fascia and tendon of lower back, initial encounter: Secondary | ICD-10-CM

## 2019-02-25 NOTE — ED Triage Notes (Signed)
Pt states he hurt his back last night at work. Pt state he was lifting something and the pain came on. Pt states he needs a work note.

## 2019-02-25 NOTE — Discharge Instructions (Addendum)
Your blood pressure was noted to be elevated during your visit today. You may return here within the next few days or when you are feeling better to recheck  HOME CARE INSTRUCTIONS: For many people, back pain returns. Since low back pain is rarely dangerous, it is often a condition that people can learn to manage on their own. Please remain active. It is stressful on the back to sit or stand in one place. Do not sit, drive, or stand in one place for more than 30 minutes at a time. Take short walks on level surfaces as soon as pain allows. Try to increase the length of time you walk each day. Do not stay in bed. Resting more than 1 or 2 days can delay your recovery. Do not avoid exercise or work. Your body is made to move. It is not dangerous to be active, even though your back may hurt. Your back will likely heal faster if you return to being active before your pain is gone. Over-the-counter medicines to reduce pain and inflammation are often the most helpful.  SEEK MEDICAL CARE IF: You have pain that is not relieved with rest or medicine. You have pain that does not improve in 1 week. You have new symptoms. You are generally not feeling well.  SEEK IMMEDIATE MEDICAL CARE IF: You have pain that radiates from your back into your legs. You develop new bowel or bladder control problems. You have unusual weakness or numbness in your arms or legs. You develop nausea or vomiting. You develop abdominal pain. You feel faint.

## 2019-02-26 NOTE — ED Provider Notes (Signed)
[facility]   [id number] 02/25/19 Arrival Time: 1511  ASSESSMENT & PLAN:  1. Strain of lumbar region, initial encounter   2. Elevated blood-pressure reading without diagnosis of hypertension    Able to ambulate here and hemodynamically stable. No indication for imaging of back at this time given no trauma and normal neurological exam. Discussed.  Prefers OTC ibuprofen. See AVS for d/c instructions. Encourage ROM/movement as tolerated.  Follow-up Information    [facility]   Specialty:  Urgent Care Why:  As needed. Contact information: [address] [place] [id number] [phone]         Discussed elevated BP. May recheck here when feeling better.  Reviewed expectations re: course of current medical issues. Questions answered. Outlined signs and symptoms indicating need for more acute intervention. Patient verbalized understanding. After Visit Summary given.   SUBJECTIVE: History from: patient.  Jonathon Jordan is a 38 y.o. male who presents with complaint of fairly persistent bilateral lower back discomfort. Onset abrupt, yesterday. Noticed after heavy lifting at work; needed to leave work early secondary to pain. Injury/trama: no. History of back problems: rare. Discomfort described as aching without radiation. Pain slightly worsened with movement and improved with rest. Progressive LE weakness or saddle anesthesia: none. Extremity sensation changes or weakness: none. Ambulatory without difficulty. Normal bowel/bladder habits: yes without urinary retention. No associated abdominal pain/n/v. Self treatment: has tried ibuprofen prn with mild help.  Reports no chronic steroid use, fevers, IV drug use, or recent back surgeries or procedures.  ROS: As per HPI. All other systems negative.   OBJECTIVE:  Vitals:   02/25/19 1531 02/25/19 1532  BP:  (!) 170/84  Resp:  18  Temp:  98.3 F (36.8 C)  SpO2:   98%  Weight: (!) 154.7 kg     General appearance: alert; no distress Neck: supple with FROM; without midline tenderness CV: RRR Lungs: unlabored respirations; symmetrical air entry Abdomen: soft, non-tender; non-distended Back: mild to moderate bilateral tenderness of his lower paraspinal musculature; FROM at waist; bruising: none; without midline tenderness Extremities: no edema; symmetrical with no gross deformities; normal ROM of bilateral lower extremities Skin: warm and dry Neurologic: normal gait; normal reflexes of RLE and LLE; normal sensation of RLE and LLE; normal strength of RLE and LLE Psychological: alert and cooperative; normal mood and affect  Allergies  Allergen Reactions  . Kiwi Extract Itching   PMH: As in HPI.  Social History   Socioeconomic History  . Marital status: Married    Spouse name: Not on file  . Number of children: Not on file  . Years of education: Not on file  . Highest education level: Not on file  Occupational History  . Not on file  Social Needs  . Financial resource strain: Not on file  . Food insecurity:    Worry: Not on file    Inability: Not on file  . Transportation needs:    Medical: Not on file    Non-medical: Not on file  Tobacco Use  . Smoking status: Current Every Day Smoker  . Smokeless tobacco: Never Used  Substance and Sexual Activity  . Alcohol use: Yes    Comment: socially  . Drug use: No  . Sexual activity: Not on file  Lifestyle  . Physical activity:    Days per week: Not on file    Minutes per session: Not on file  . Stress: Not on file  Relationships  .  Social connections:    Talks on phone: Not on file    Gets together: Not on file    Attends religious service: Not on file    Active member of club or organization: Not on file    Attends meetings of clubs or organizations: Not on file    Relationship status: Not on file  . Intimate partner violence:    Fear of current or ex partner: Not on file     Emotionally abused: Not on file    Physically abused: Not on file    Forced sexual activity: Not on file  Other Topics Concern  . Not on file  Social History Narrative  . Not on file   Family History  Problem Relation Age of Onset  . Hypertension Mother   . Diabetes Mother   . Hypertension Father   . Diabetes Father    Past Surgical History:  Procedure Laterality Date  . Christy Gentles, MD 02/26/19 [phone]

## 2019-03-01 ENCOUNTER — Ambulatory Visit (HOSPITAL_COMMUNITY)
Admission: EM | Admit: 2019-03-01 | Discharge: 2019-03-01 | Disposition: A | Discharge status: Home / Self Care | Payer: Managed Care, Other (non HMO) | Attending: Family Medicine | Admitting: Family Medicine

## 2019-03-01 ENCOUNTER — Encounter (HOSPITAL_COMMUNITY): Payer: Self-pay | Admitting: Emergency Medicine

## 2019-03-01 ENCOUNTER — Other Ambulatory Visit: Payer: Self-pay

## 2019-03-01 DIAGNOSIS — R030 Elevated blood-pressure reading, without diagnosis of hypertension: Secondary | ICD-10-CM

## 2019-03-01 DIAGNOSIS — S39012D Strain of muscle, fascia and tendon of lower back, subsequent encounter: Secondary | ICD-10-CM | POA: Diagnosis not present

## 2019-03-01 NOTE — ED Triage Notes (Signed)
Pt here for lower back pain with radiation to left leg; pt seen here on Tuesday for same and needs work note

## 2019-03-01 NOTE — ED Provider Notes (Signed)
[facility]   [id number] 03/01/19 Arrival Time: 1024  ASSESSMENT & PLAN:  1. Strain of lumbar region, subsequent encounter   2. Elevated blood-pressure reading without diagnosis of hypertension    Written information given on HTN. Prefers to check BP over the next couple of weeks and keep a log before starting medication. Written information on HTN given. Has a PCP that he is going to call to see if he can est care. May f/u here as needed.  Back pain improved. Return to work note given.  Reviewed expectations re: course of current medical issues. Questions answered. Outlined signs and symptoms indicating need for more acute intervention. Patient verbalized understanding. After Visit Summary given.   SUBJECTIVE:  Jonathon Jordan is a 38 y.o. male who presents with concerns regarding increased blood pressures; noticed on prior visits. He reports that he has not been treated for hypertension in the past.  He reports no chest pain on exertion, no dyspnea on exertion, no swelling of ankles, no orthostatic dizziness or lightheadedness, no orthopnea or paroxysmal nocturnal dyspnea, no palpitations and no intermittent claudication symptoms.  Social History   Tobacco Use  Smoking Status Current Every Day Smoker  Smokeless Tobacco Never Used   Reports low back strain last week. Seen here by me on 02/25/2019; note reviewed. OTC ibuprofen. Back pain has improved dramatically. Ambulatory without difficulty. Requests return to work note. Missed work yesterday secondary to back pain. Requesting return to work note.  ROS: As per HPI. All other systems negative.   OBJECTIVE:  Vitals:   03/01/19 1044  BP: (!) 174/99  Pulse: 90  Resp: 18  Temp: 98.1 F (36.7 C)  TempSrc: Oral  SpO2: 99%    General appearance: alert; no distress Eyes: PERRLA; EOMI HENT: normocephalic; atraumatic Neck: supple Lungs: clear to auscultation bilaterally Heart: regular rate and rhythm without  murmer Back: FROM at waist; no midline or muscular tenderness to palpation Abdomen: soft, non-tender; bowel sounds normal Extremities: no edema; symmetrical with no gross deformities Skin: warm and dry Psychological: alert and cooperative; normal mood and affect   Allergies  Allergen Reactions  . Kiwi Extract Itching     Social History   Socioeconomic History  . Marital status: Married    Spouse name: Not on file  . Number of children: Not on file  . Years of education: Not on file  . Highest education level: Not on file  Occupational History  . Not on file  Social Needs  . Financial resource strain: Not on file  . Food insecurity:    Worry: Not on file    Inability: Not on file  . Transportation needs:    Medical: Not on file    Non-medical: Not on file  Tobacco Use  . Smoking status: Current Every Day Smoker  . Smokeless tobacco: Never Used  Substance and Sexual Activity  . Alcohol use: Yes    Comment: socially  . Drug use: No  . Sexual activity: Not on file  Lifestyle  . Physical activity:    Days per week: Not on file    Minutes per session: Not on file  . Stress: Not on file  Relationships  . Social connections:    Talks on phone: Not on file    Gets together: Not on file    Attends religious service: Not on file    Active member of club or organization: Not on file    Attends meetings of clubs or organizations: Not on  file    Relationship status: Not on file  . Intimate partner violence:    Fear of current or ex partner: Not on file    Emotionally abused: Not on file    Physically abused: Not on file    Forced sexual activity: Not on file  Other Topics Concern  . Not on file  Social History Narrative  . Not on file   Family History  Problem Relation Age of Onset  . Hypertension Mother   . Diabetes Mother   . Hypertension Father   . Diabetes Father    Past Surgical History:  Procedure Laterality Date  . Christy GentlesAPPENDECTOMY        Jymir Dunaj,  Jermichael Belmares, MD 03/11/19 [id number]

## 2019-10-21 ENCOUNTER — Ambulatory Visit (HOSPITAL_COMMUNITY)
Admission: EM | Admit: 2019-10-21 | Discharge: 2019-10-21 | Disposition: A | Discharge status: Home / Self Care | Payer: Managed Care, Other (non HMO) | Attending: Physician Assistant | Admitting: Physician Assistant

## 2019-10-21 ENCOUNTER — Other Ambulatory Visit: Payer: Self-pay

## 2019-10-21 ENCOUNTER — Encounter (HOSPITAL_COMMUNITY): Payer: Self-pay | Admitting: Emergency Medicine

## 2019-10-21 DIAGNOSIS — A749 Chlamydial infection, unspecified: Secondary | ICD-10-CM | POA: Insufficient documentation

## 2019-10-21 DIAGNOSIS — Z113 Encounter for screening for infections with a predominantly sexual mode of transmission: Secondary | ICD-10-CM | POA: Insufficient documentation

## 2019-10-21 DIAGNOSIS — Z20828 Contact with and (suspected) exposure to other viral communicable diseases: Secondary | ICD-10-CM | POA: Insufficient documentation

## 2019-10-21 DIAGNOSIS — F17200 Nicotine dependence, unspecified, uncomplicated: Secondary | ICD-10-CM | POA: Diagnosis not present

## 2019-10-21 DIAGNOSIS — Z79899 Other long term (current) drug therapy: Secondary | ICD-10-CM | POA: Diagnosis not present

## 2019-10-21 DIAGNOSIS — R110 Nausea: Secondary | ICD-10-CM | POA: Diagnosis not present

## 2019-10-21 DIAGNOSIS — R519 Headache, unspecified: Secondary | ICD-10-CM | POA: Diagnosis not present

## 2019-10-21 DIAGNOSIS — R109 Unspecified abdominal pain: Secondary | ICD-10-CM | POA: Diagnosis present

## 2019-10-21 LAB — HIV ANTIBODY (ROUTINE TESTING W REFLEX): HIV Screen 4th Generation wRfx: NONREACTIVE

## 2019-10-21 NOTE — ED Provider Notes (Signed)
[facility]    CSN: [id number] Arrival date & time: 10/21/19  1106      History   Chief Complaint Chief Complaint  Patient presents with  . Abdominal Pain    HPI Jonathon Jordan is a 38 y.o. male.   Patient reports to urgent care today for nausea and headache 2 days ago. Patient reports some abdominal discomfort and nausea that required him to leave work. He did not vomit or have diarrhea. He felt better the next day. He now endorses a mild headache since yesterday. He has not taken anything for this. He denies cough, congestion, chest pain, shortness of breath, change or loss of smell or taste. He did not have a known covid contact. His work is requiring a covid test for him to return.  He also requests STI testing due to entering a new relationship. He does not a history of sex with a partner who had been treated for STI prior to their relationship. He has been sexually active with multiple partners in recent months but has been utilizing protection. He denies symptoms of painful urination, penile discharge or lower abdominal pain.      History reviewed. No pertinent past medical history.  There are no problems to display for this patient.   Past Surgical History:  Procedure Laterality Date  . APPENDECTOMY         Home Medications    Prior to Admission medications   Medication Sig Start Date End Date Taking? Authorizing Provider  hydrochlorothiazide (MICROZIDE) 12.5 MG capsule Take 12.5 mg by mouth daily.   Yes [provider]  losartan (COZAAR) 25 MG tablet Take 50 mg by mouth daily.   Yes [provider]  cyclobenzaprine (FLEXERIL) 10 MG tablet Take 1 tablet (10 mg total) by mouth 2 (two) times daily as needed for muscle spasms. 08/12/18   Loura Halt A, NP  HYDROcodone-acetaminophen (NORCO/VICODIN) 5-325 MG per tablet Take 2 tablets by mouth every 4 (four) hours as needed. Patient not taking: Reported on 05/03/2015 02/28/14   Tanna Furry, MD  ibuprofen (ADVIL,MOTRIN) 200 MG tablet Take 200 mg by mouth every 6 (six) hours as needed for moderate pain.    [provider]  ibuprofen (ADVIL,MOTRIN) 800 MG tablet Take 1 tablet (800 mg total) by mouth 3 (three) times daily. 05/03/15   Glendell Docker, NP  Menthol, Topical Analgesic, (ICY HOT EX) Apply 1 application topically daily as needed (pain).     [provider]    Family History Family History  Problem Relation Age of Onset  . Hypertension Mother   . Diabetes Mother   . Hypertension Father   . Diabetes Father     Social History Social History   Tobacco Use  . Smoking status: Current Every Day Smoker  . Smokeless tobacco: Never Used  Substance Use Topics  . Alcohol use: Yes    Comment: socially  . Drug use: No     Allergies   Kiwi extract   Review of Systems Review of Systems  Constitutional: Negative for chills and fever.  HENT: Negative for congestion, ear pain, postnasal drip, rhinorrhea, sinus pressure, sinus pain and sore throat.   Eyes: Negative for pain and visual disturbance.  Respiratory: Negative for cough and shortness of breath.   Cardiovascular: Negative for chest pain and palpitations.  Gastrointestinal: Positive for nausea. Negative for abdominal pain, constipation, diarrhea and vomiting.  Genitourinary: Negative for dysuria, genital sores, hematuria, penile pain, testicular pain and urgency.  Musculoskeletal: Negative for arthralgias, back pain and myalgias.  Skin: Negative for color change and rash.  Neurological: Negative for seizures and syncope.  All other systems reviewed and are negative.    Physical Exam Triage Vital Signs ED Triage Vitals [10/21/19 1353]  Enc Vitals Group     BP (!) 157/88     Pulse Rate 83     Resp 16     Temp 98.6 F (37 C)     Temp Source Oral     SpO2 100 %     Weight      Height      Head Circumference      Peak Flow      Pain Score      Pain Loc      Pain Edu?       Excl. in GC?    No data found.  Updated Vital Signs BP (!) 157/88   Pulse 83   Temp 98.6 F (37 C) (Oral)   Resp 16   SpO2 100%   Visual Acuity Right Eye Distance:   Left Eye Distance:   Bilateral Distance:    Right Eye Near:   Left Eye Near:    Bilateral Near:     Physical Exam Vitals and nursing note reviewed.  Constitutional:      General: He is not in acute distress.    Appearance: He is well-developed and normal weight. He is not ill-appearing.  HENT:     Head: Normocephalic and atraumatic.  Eyes:     Conjunctiva/sclera: Conjunctivae normal.  Cardiovascular:     Rate and Rhythm: Normal rate and regular rhythm.     Heart sounds: Normal heart sounds. No murmur.  Pulmonary:     Effort: Pulmonary effort is normal. No respiratory distress.     Breath sounds: No stridor. No wheezing or rales.  Abdominal:     General: Abdomen is flat. Bowel sounds are normal.     Palpations: Abdomen is soft.     Tenderness: There is no abdominal tenderness. There is no right CVA tenderness or left CVA tenderness.  Genitourinary:    Penis: Normal.      Testes: Normal.  Musculoskeletal:     Cervical back: Neck supple.  Skin:    General: Skin is warm and dry.  Neurological:     General: No focal deficit present.     Mental Status: He is alert and oriented to person, place, and time.  Psychiatric:        Mood and Affect: Mood normal.        Behavior: Behavior normal.      UC Treatments / Results  Labs (all labs ordered are listed, but only abnormal results are displayed) Labs Reviewed - No data to display  EKG   Radiology No results found.  Procedures Procedures (including critical care time)  Medications Ordered in UC Medications - No data to display  Initial Impression / Assessment and Plan / UC Course  I have reviewed the triage vital signs and the nursing notes.  Pertinent labs & imaging results that were available during my care of the patient were reviewed  by me and considered in my medical decision making (see chart for details).     #nausea #Headache - uncertain if these are related to a viral illness. GI symptoms have ceased. Headache is mild, treating with tylenol. COVID PCR Sent.  #STI testing - no known positive contact. Asymptomatic. GC/CT/Trich, HIV and RPR sent.  Final Clinical Impressions(s) / UC Diagnoses   Final diagnoses:  None   Discharge Instructions   None    ED Prescriptions    None     PDMP not reviewed this encounter.   Hermelinda Medicusarr, Xaviar Lunn E, PA-C 10/21/19 1424

## 2019-10-21 NOTE — ED Triage Notes (Signed)
PT had abdominal pain 2 days ago. Headache yesterday and today. No symptoms right now. Needs a note to return to work. His job requires the COVID test to return  He'd also like STD testing.

## 2019-10-21 NOTE — Discharge Instructions (Signed)
We have sent your lab work out and these results should be available in 2-4 days. We will call you with positive results, negative results can be seen on mychart.   Take tylenol 325mg  tablet x 2 up to every 6 hours for headache. Do not exceed 8 tablets in 24 hours   If your Covid-19 test is positive, you will receive a phone call from [facility] regarding your results. Negative test results are not called. Both positive and negative results area always visible on MyChart. If you do not have a MyChart account, sign up instructions are in your discharge papers.   Persons who are directed to care for themselves at home may discontinue isolation under the following conditions:   At least 10 days have passed since symptom onset and  At least 24 hours have passed without running a fever (this means without the use of fever-reducing medications) and  Other symptoms have improved.  Persons infected with COVID-19 who never develop symptoms may discontinue isolation and other precautions 10 days after the date of their first positive COVID-19 test.

## 2019-10-22 LAB — RPR: RPR Ser Ql: NONREACTIVE

## 2019-10-22 LAB — CYTOLOGY, (ORAL, ANAL, URETHRAL) ANCILLARY ONLY
Chlamydia: POSITIVE — AB
Neisseria Gonorrhea: NEGATIVE
Trichomonas: NEGATIVE

## 2019-10-23 ENCOUNTER — Telehealth (HOSPITAL_COMMUNITY): Payer: Self-pay | Admitting: Emergency Medicine

## 2019-10-23 LAB — NOVEL CORONAVIRUS, NAA (HOSP ORDER, SEND-OUT TO REF LAB; TAT 18-24 HRS): SARS-CoV-2, NAA: NOT DETECTED

## 2019-10-23 MED ORDER — AZITHROMYCIN 250 MG PO TABS: 1000.0000 mg | ORAL_TABLET | Freq: Once | ORAL | 0 refills | Status: AC

## 2019-10-23 NOTE — Telephone Encounter (Signed)
Chlamydia is positive.  Rx po zithromax 1g #1 dose no refills was sent to the pharmacy of record.  Pt needs education to please refrain from sexual intercourse for 7 days to give the medicine time to work, sexual partners need to be notified and tested/treated.  Condoms may reduce risk of reinfection.  Recheck or followup with PCP for further evaluation if symptoms are not improving.   [facility] notified.  Patient contacted by phone and made aware of    results. Pt verbalized understanding and had all questions answered.    

## 2019-10-24 ENCOUNTER — Ambulatory Visit (HOSPITAL_COMMUNITY)
Admission: EM | Admit: 2019-10-24 | Discharge: 2019-10-24 | Disposition: A | Discharge status: Home / Self Care | Payer: Managed Care, Other (non HMO) | Attending: Family Medicine | Admitting: Family Medicine

## 2019-10-24 ENCOUNTER — Other Ambulatory Visit: Payer: Self-pay

## 2019-10-24 ENCOUNTER — Encounter (HOSPITAL_COMMUNITY): Payer: Self-pay

## 2019-10-24 MED ORDER — AZITHROMYCIN 250 MG PO TABS
ORAL_TABLET | ORAL | Status: AC
  Filled 2019-10-24: qty 4

## 2019-10-24 MED ORDER — AZITHROMYCIN 250 MG PO TABS
1000.0000 mg | ORAL_TABLET | Freq: Once | ORAL | Status: AC
  Administered 2019-10-24: 1000 mg via ORAL

## 2019-10-24 NOTE — ED Triage Notes (Signed)
Pt presents to [facility] for work letter to return to work, COVID test was negative and to be treat it with azithromycin 1 g.
